# Patient Record
Sex: Female | Born: 1955 | Race: Black or African American | Hispanic: No | Marital: Single | Smoking: Never smoker
Health system: Southern US, Community
[De-identification: ages and names within clinical notes are randomized; demographics above are authoritative.]

## PROBLEM LIST (undated history)

## (undated) DIAGNOSIS — G8929 Other chronic pain: Secondary | ICD-10-CM

## (undated) DIAGNOSIS — M25559 Pain in unspecified hip: Secondary | ICD-10-CM

## (undated) DIAGNOSIS — F419 Anxiety disorder, unspecified: Secondary | ICD-10-CM

## (undated) DIAGNOSIS — M797 Fibromyalgia: Secondary | ICD-10-CM

## (undated) DIAGNOSIS — M549 Dorsalgia, unspecified: Secondary | ICD-10-CM

## (undated) DIAGNOSIS — I10 Essential (primary) hypertension: Secondary | ICD-10-CM

## (undated) HISTORY — PX: BACK SURGERY: SHX140

---

## 2016-12-13 ENCOUNTER — Encounter (HOSPITAL_BASED_OUTPATIENT_CLINIC_OR_DEPARTMENT_OTHER): Payer: Self-pay | Admitting: Emergency Medicine

## 2016-12-13 ENCOUNTER — Emergency Department (HOSPITAL_BASED_OUTPATIENT_CLINIC_OR_DEPARTMENT_OTHER)
Admission: EM | Admit: 2016-12-13 | Discharge: 2016-12-13 | Disposition: A | Discharge status: Home / Self Care | Payer: Medicaid Other | Attending: Emergency Medicine | Admitting: Emergency Medicine

## 2016-12-13 ENCOUNTER — Emergency Department (HOSPITAL_BASED_OUTPATIENT_CLINIC_OR_DEPARTMENT_OTHER): Payer: Medicaid Other

## 2016-12-13 DIAGNOSIS — M25561 Pain in right knee: Secondary | ICD-10-CM | POA: Diagnosis present

## 2016-12-13 DIAGNOSIS — M1711 Unilateral primary osteoarthritis, right knee: Secondary | ICD-10-CM | POA: Diagnosis not present

## 2016-12-13 DIAGNOSIS — I10 Essential (primary) hypertension: Secondary | ICD-10-CM | POA: Insufficient documentation

## 2016-12-13 DIAGNOSIS — Z79899 Other long term (current) drug therapy: Secondary | ICD-10-CM | POA: Diagnosis not present

## 2016-12-13 HISTORY — DX: Fibromyalgia: M79.7

## 2016-12-13 HISTORY — DX: Essential (primary) hypertension: I10

## 2016-12-13 HISTORY — DX: Pain in unspecified hip: M25.559

## 2016-12-13 HISTORY — DX: Anxiety disorder, unspecified: F41.9

## 2016-12-13 HISTORY — DX: Other chronic pain: G89.29

## 2016-12-13 HISTORY — DX: Dorsalgia, unspecified: M54.9

## 2016-12-13 MED ORDER — HYDROCODONE-ACETAMINOPHEN 5-325 MG PO TABS
1.0000 | ORAL_TABLET | Freq: Once | ORAL | Status: AC
  Administered 2016-12-13: 1 via ORAL
  Filled 2016-12-13: qty 1

## 2016-12-13 MED ORDER — HYDROCODONE-ACETAMINOPHEN 5-325 MG PO TABS: 1.0000 | ORAL_TABLET | Freq: Four times a day (QID) | ORAL | 0 refills | Status: AC | PRN

## 2016-12-13 MED ORDER — NAPROXEN 500 MG PO TABS: 500.0000 mg | ORAL_TABLET | Freq: Two times a day (BID) | ORAL | 0 refills | Status: AC

## 2016-12-13 MED ORDER — ONDANSETRON 4 MG PO TBDP
4.0000 mg | ORAL_TABLET | Freq: Once | ORAL | Status: AC
  Administered 2016-12-13: 4 mg via ORAL
  Filled 2016-12-13: qty 1

## 2016-12-13 NOTE — ED Provider Notes (Signed)
[facility] Provider Note   CSN: [id number] Arrival date & time: 12/13/16  1449  By signing my name below, I, Monica Hodge., attest that this documentation has been prepared under the direction and in the presence of No att. providers found. Electronically signed: Bing Hodge., ED Scribe. 12/13/16. 6:24 PM.    History   Chief Complaint Chief Complaint  Patient presents with  . Knee Pain    HPI Monica Hodge is a 61 y.o. female with hx of HTN, chronic R hip pain who presents to the Emergency Department complaining of R knee pain with onset x2 days. Pt is in town for a convention from [place] and states that after the trip x3 days ago she started experiencing bilateral lower extremity swelling and R knee pain. Pt reports the R knee pain being exacerbated with walking and twisting of the knee. Pt reportedly tried to stand x1 day ago and the R knee gave out causing her to fall. She reports hearing a crunching sound with ambulation. Pt has elevated the legs with mild relief of the swelling. Pt denies any other modifying factors. She denies pain over the R patella. Of note, pt reports known allergy to Percocet. Pt states that she recently had an echocardiogram done for suspicion of CHF and was prescribed lasix but has not filled the prescription.  The history is provided by the patient. No language interpreter was used.  Knee Pain   This is a new problem. The current episode started 2 days ago. The problem occurs constantly. The problem has been gradually worsening. The pain is present in the right knee. The quality of the pain is described as aching. The pain is mild. The symptoms are aggravated by standing and activity (ambulation, twisting). She has tried nothing for the symptoms. The treatment provided no relief. There has been no history of extremity trauma.    Past Medical History:  Diagnosis Date  . Anxiety   . Chronic back pain   . Chronic hip pain     . Fibromyalgia   . Hypertension     There are no active problems to display for this patient.   Past Surgical History:  Procedure Laterality Date  . BACK SURGERY      OB History    No data available       Home Medications    Prior to Admission medications   Medication Sig Start Date End Date Taking? Authorizing Provider  amLODipine (NORVASC) 10 MG tablet Take 10 mg by mouth daily.   Yes [provider]  diazepam (VALIUM) 5 MG tablet Take 5 mg by mouth every 6 (six) hours as needed for anxiety.   Yes [provider]  furosemide (LASIX) 40 MG tablet Take 40 mg by mouth.   Yes [provider]  HYDROmorphone (DILAUDID) 8 MG tablet Take 8 mg by mouth every 4 (four) hours as needed for severe pain.   Yes [provider]  levETIRAcetam (KEPPRA) 750 MG tablet Take 750 mg by mouth 2 (two) times daily.   Yes [provider]  meloxicam (MOBIC) 7.5 MG tablet Take 7.5 mg by mouth daily.   Yes [provider]  nortriptyline (PAMELOR) 75 MG capsule Take 75 mg by mouth at bedtime.   Yes [provider]  pregabalin (LYRICA) 150 MG capsule Take 150 mg by mouth 2 (two) times daily.   Yes [provider]  tizanidine (ZANAFLEX) 6 MG capsule Take 6 mg by  mouth 3 (three) times daily.   Yes [provider]  HYDROcodone-acetaminophen (NORCO/VICODIN) 5-325 MG tablet Take 1 tablet by mouth every 6 (six) hours as needed for moderate pain. 12/13/16   Jerelyn Scott, MD  naproxen (NAPROSYN) 500 MG tablet Take 1 tablet (500 mg total) by mouth 2 (two) times daily. 12/13/16   Jerelyn Scott, MD    Family History History reviewed. No pertinent family history.  Social History Social History  Substance Use Topics  . Smoking status: Never Smoker  . Smokeless tobacco: Never Used  . Alcohol use No     Allergies   Percocet [oxycodone-acetaminophen]   Review of Systems Review of Systems  Cardiovascular: Positive for leg  swelling.  Musculoskeletal: Positive for arthralgias (R knee).  All other systems reviewed and are negative.    Physical Exam Updated Vital Signs BP (!) 145/92 (BP Location: Right Arm)   Pulse 63   Temp 97.8 F (36.6 C) (Oral)   Resp 20   Ht 5\' 10"  (1.778 m)   Wt 265 lb (120.2 kg)   SpO2 95%   BMI 38.02 kg/m  Vitals reviewed Physical Exam Physical Examination: General appearance - alert, well appearing, and in no distress Mental status - alert, oriented to person, place, and time Eyes - no conjunctival injection, no scleral icterus Chest - clear to auscultation, no wheezes, rales or rhonchi, symmetric air entry Heart - normal rate, regular rhythm, normal S1, S2, no murmurs, rubs, clicks or gallops Neurological - alert, oriented, normal speech, sensation and strength intact in bilateral lower extremities Musculoskeletal - right knee with ttp over medial joint line and lateral joint line, pain with internal and external rotation of right knee, no ballotable effusion, otherwise no joint tenderness, deformity or swelling Extremities - peripheral pulses normal, bilateral and symmetric 2+pedal edema, no clubbing or cyanosis Skin - normal coloration and turgor, no rashes  ED Treatments / Results   DIAGNOSTIC STUDIES: Oxygen Saturation is 100% on RA, normal by my interpretation.   COORDINATION OF CARE: 6:24 PM-Discussed next steps with pt. Pt verbalized understanding and is agreeable with the plan.    Labs (all labs ordered are listed, but only abnormal results are displayed) Labs Reviewed - No data to display  EKG  EKG Interpretation None       Radiology Dg Knee Complete 4 Views Right  Result Date: 12/13/2016 CLINICAL DATA:  Right knee pain since last night.  No injury. EXAM: RIGHT KNEE - COMPLETE 4+ VIEW COMPARISON:  None. FINDINGS: There is mild tricompartmental osteoarthritic change. No evidence of fracture or dislocation. Possible small joint effusion. IMPRESSION:  No acute fracture or dislocation. Mild tricompartmental osteoarthritis and possible small joint effusion. Electronically Signed   By: Elberta Fortis M.D.   On: 12/13/2016 15:50    Procedures Procedures (including critical care time)  Medications Ordered in ED Medications  HYDROcodone-acetaminophen (NORCO/VICODIN) 5-325 MG per tablet 1 tablet (1 tablet Oral Given 12/13/16 1523)  ondansetron (ZOFRAN-ODT) disintegrating tablet 4 mg (4 mg Oral Given 12/13/16 1524)     Initial Impression / Assessment and Plan / ED Course  I have reviewed the triage vital signs and the nursing notes.  Pertinent labs & imaging results that were available during my care of the patient were reviewed by me and considered in my medical decision making (see chart for details).     Pt presenting with c/o right knee pain.  Xray shows arthritis with small effusion.  Pt placed in knee immobilizer- and crutches.  Discharged with anti inflammatories and small amount of pain medications.  Given information for sports medicine followup or will need to follow up at her home if she will not be in town long enough.  Discharged with strict return precautions.  Pt agreeable with plan.  Final Clinical Impressions(s) / ED Diagnoses   Final diagnoses:  Acute pain of right knee  Osteoarthritis of right knee, unspecified osteoarthritis type    New Prescriptions Discharge Medication List as of 12/13/2016  4:20 PM    START taking these medications   Details  HYDROcodone-acetaminophen (NORCO/VICODIN) 5-325 MG tablet Take 1 tablet by mouth every 6 (six) hours as needed for moderate pain., Starting Sat 12/13/2016, Print    naproxen (NAPROSYN) 500 MG tablet Take 1 tablet (500 mg total) by mouth 2 (two) times daily., Starting Sat 12/13/2016, Print       I personally performed the services described in this documentation, which was scribed in my presence. The recorded information has been reviewed and is accurate.      Jerelyn ScottLinker,  Martha, MD 12/13/16 2033

## 2016-12-13 NOTE — Discharge Instructions (Signed)
Return to the ED with any concerns including increased swelling of one leg, chest pain, difficulty breathing, not able to bear weight on leg, or any other alarming symptoms

## 2016-12-13 NOTE — ED Triage Notes (Signed)
Patient states that she is here for a convention. Came from [place] 2 days ago. Have swelling in her bilateral lower extremities and pain to her right knee

## 2016-12-13 NOTE — ED Notes (Signed)
ED Provider at bedside. 

## 2016-12-13 NOTE — ED Notes (Signed)
Pt states she has noticed increased pain and swelling as well as weakness in her right knee. Hx chronic hip pain and takes meds for same, but they did not help this pain.

## 2018-04-08 IMAGING — DX DG KNEE COMPLETE 4+V*R*
4 series · 4 of 4 positions shown · non-contrast
Comparison: None.

CLINICAL DATA: Right knee pain since last night.  No injury.

EXAM:
RIGHT KNEE - COMPLETE 4+ VIEW

[knee ap]
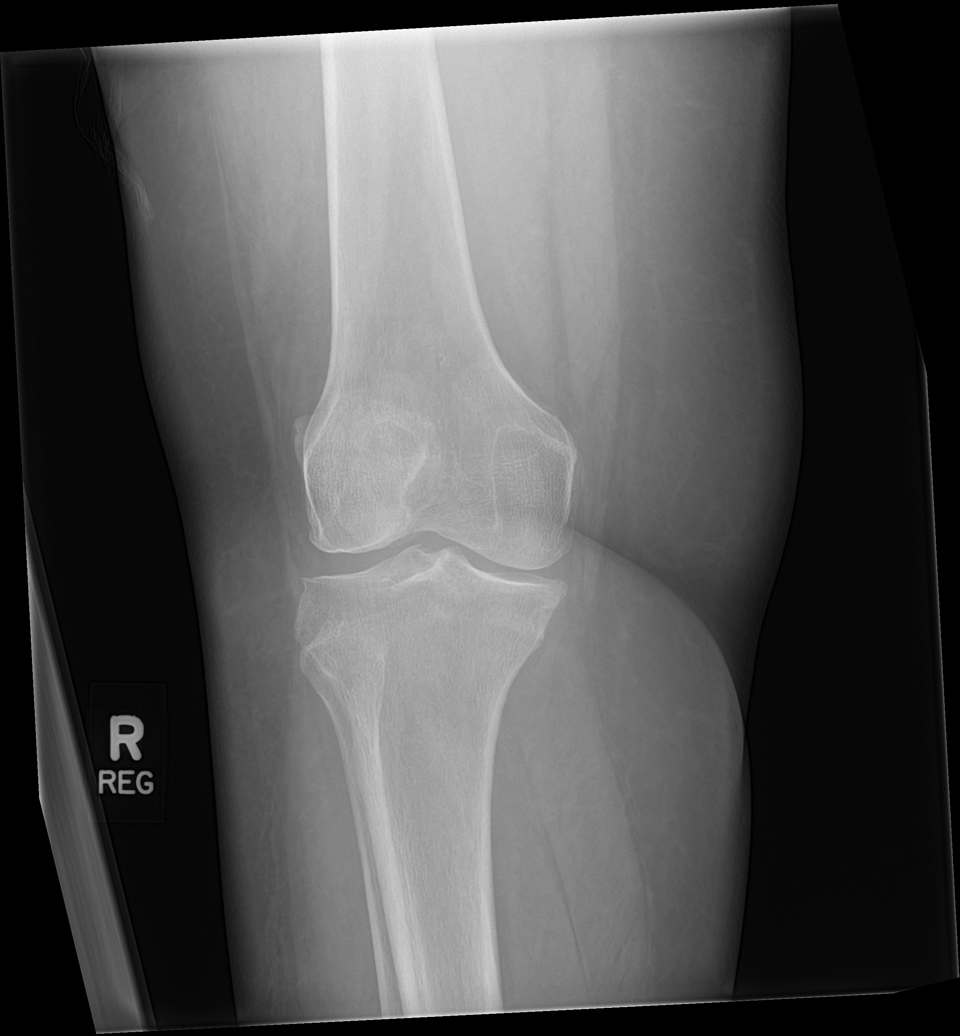

[tunnel]
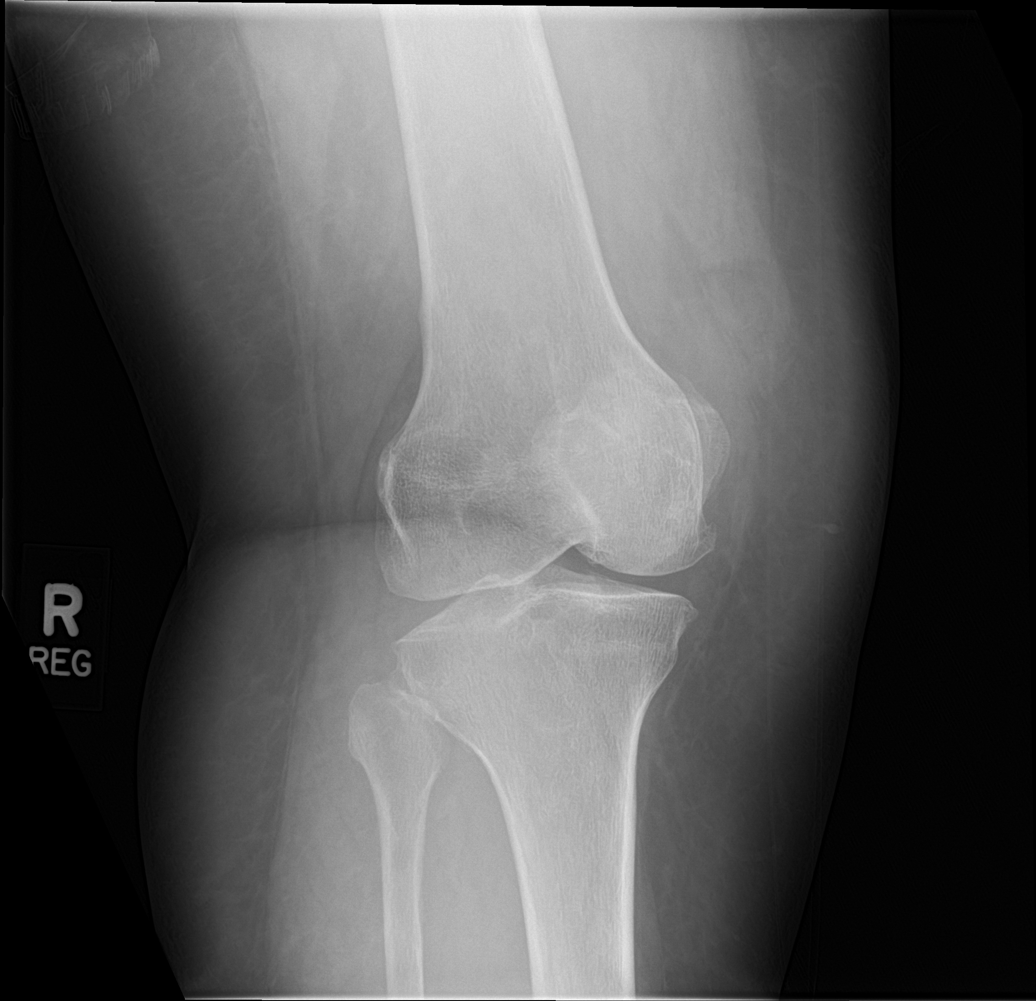

[knee lat]
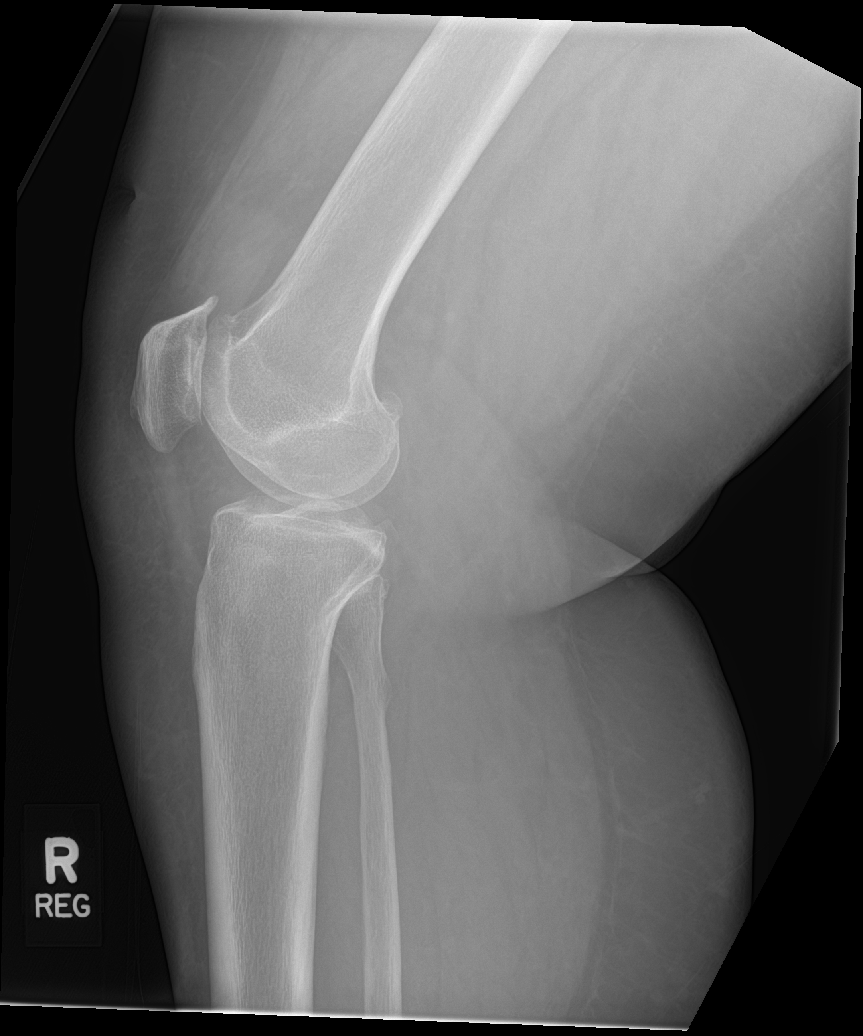

[knee obl]
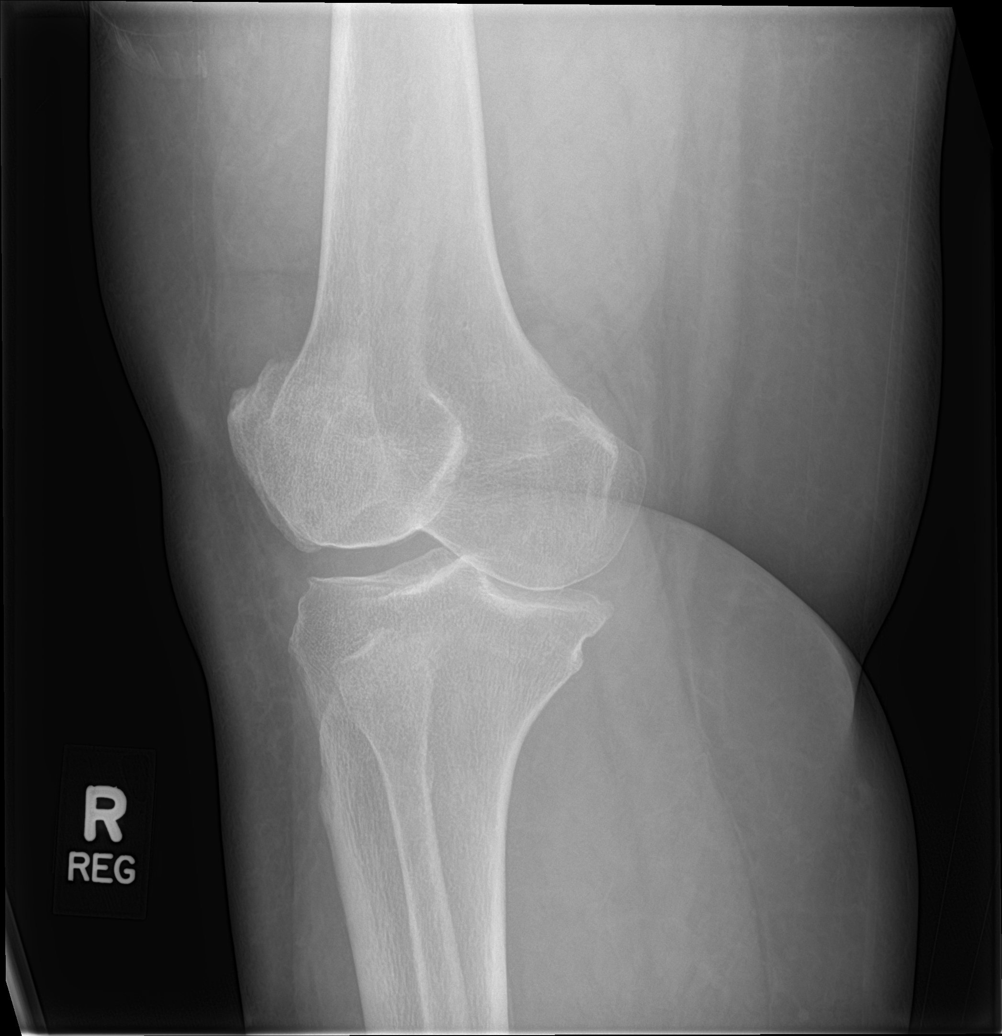

[4 of 4 positions shown; findings below may reference images not displayed]

FINDINGS: There is mild tricompartmental osteoarthritic change. No evidence of
fracture or dislocation. Possible small joint effusion.
IMPRESSION: No acute fracture or dislocation.

Mild tricompartmental osteoarthritis and possible small joint
effusion.
